# Patient Record
Sex: Female | Born: 1960 | Hispanic: Yes | Marital: Married | Smoking: Never smoker
Health system: Southern US, Community
[De-identification: ages and names within clinical notes are randomized; demographics above are authoritative.]

---

## 2007-12-08 ENCOUNTER — Ambulatory Visit: Payer: Self-pay

## 2012-10-09 ENCOUNTER — Emergency Department: Payer: Self-pay | Admitting: Internal Medicine

## 2013-06-17 ENCOUNTER — Ambulatory Visit: Payer: Self-pay | Admitting: Family Medicine

## 2013-08-11 HISTORY — PX: BREAST BIOPSY: SHX20

## 2013-08-30 ENCOUNTER — Ambulatory Visit: Payer: Self-pay | Admitting: Family Medicine

## 2013-09-05 ENCOUNTER — Ambulatory Visit: Payer: Self-pay | Admitting: Family Medicine

## 2013-09-07 LAB — PATHOLOGY REPORT

## 2017-07-24 ENCOUNTER — Other Ambulatory Visit: Payer: Self-pay

## 2017-07-24 ENCOUNTER — Encounter: Payer: Self-pay | Admitting: Emergency Medicine

## 2017-07-24 ENCOUNTER — Emergency Department
Admission: EM | Admit: 2017-07-24 | Discharge: 2017-07-24 | Disposition: A | Discharge status: Home / Self Care | Payer: BLUE CROSS/BLUE SHIELD | Attending: Emergency Medicine | Admitting: Emergency Medicine

## 2017-07-24 DIAGNOSIS — B029 Zoster without complications: Secondary | ICD-10-CM | POA: Insufficient documentation

## 2017-07-24 DIAGNOSIS — R21 Rash and other nonspecific skin eruption: Secondary | ICD-10-CM | POA: Diagnosis present

## 2017-07-24 LAB — COMPREHENSIVE METABOLIC PANEL
ALK PHOS: 88 U/L (ref 38–126)
ALT: 30 U/L (ref 14–54)
AST: 28 U/L (ref 15–41)
Albumin: 4.3 g/dL (ref 3.5–5.0)
Anion gap: 8 (ref 5–15)
BUN: 20 mg/dL (ref 6–20)
CALCIUM: 9.7 mg/dL (ref 8.9–10.3)
CO2: 26 mmol/L (ref 22–32)
CREATININE: 0.6 mg/dL (ref 0.44–1.00)
Chloride: 103 mmol/L (ref 101–111)
GFR calc Af Amer: >60 mL/min (ref >60)
GFR calc non Af Amer: >60 mL/min (ref >60)
GLUCOSE: 103 mg/dL — AB (ref 65–99)
Potassium: 4.1 mmol/L (ref 3.5–5.1)
Sodium: 137 mmol/L (ref 135–145)
Total Bilirubin: 0.6 mg/dL (ref 0.3–1.2)
Total Protein: 7.9 g/dL (ref 6.5–8.1)

## 2017-07-24 LAB — LIPASE, BLOOD: LIPASE: 21 U/L (ref 11–51)

## 2017-07-24 LAB — CBC
HCT: 41.5 % (ref 35.0–47.0)
Hemoglobin: 14.1 g/dL (ref 12.0–16.0)
MCH: 28.8 pg (ref 26.0–34.0)
MCHC: 34 g/dL (ref 32.0–36.0)
MCV: 84.7 fL (ref 80.0–100.0)
PLATELETS: 211 10*3/uL (ref 150–440)
RBC: 4.9 MIL/uL (ref 3.80–5.20)
RDW: 12.8 % (ref 11.5–14.5)
WBC: 9.8 10*3/uL (ref 3.6–11.0)

## 2017-07-24 MED ORDER — ONDANSETRON 4 MG PO TBDP: 4.0000 mg | ORAL_TABLET | Freq: Three times a day (TID) | ORAL | 0 refills | Status: DC | PRN

## 2017-07-24 MED ORDER — ONDANSETRON 4 MG PO TBDP
4.0000 mg | ORAL_TABLET | Freq: Once | ORAL | Status: AC
  Administered 2017-07-24: 4 mg via ORAL
  Filled 2017-07-24: qty 1

## 2017-07-24 MED ORDER — OXYCODONE-ACETAMINOPHEN 5-325 MG PO TABS: 1.0000 | ORAL_TABLET | Freq: Four times a day (QID) | ORAL | 0 refills | Status: AC | PRN

## 2017-07-24 MED ORDER — OXYCODONE-ACETAMINOPHEN 5-325 MG PO TABS
1.0000 | ORAL_TABLET | Freq: Once | ORAL | Status: AC
  Administered 2017-07-24: 1 via ORAL
  Filled 2017-07-24: qty 1

## 2017-07-24 NOTE — ED Notes (Signed)
Daphine DeutscherMartin #[id number]

## 2017-07-24 NOTE — ED Provider Notes (Signed)
[facility] Emergency Department Provider Note  Time seen: 7:32 PM  I have reviewed the triage vital signs and the nursing notes. Spanish interpreter used for this evaluation  HISTORY  Chief Complaint Abdominal Pain and Back Pain    HPI Kelly Summers is a 56 y.o. female with no past medical history presents to the emergency department for rash and pain.  According to the patient for the past 7 or 8 days she has been experiencing pain in the right lower back wrapping around to the right lower abdomen.  Was seen by her doctor for the same and noted to have a rash in this area was started on valacyclovir and prednisone approximately 5 days ago.  Patient states she continues to have pain in this area and since starting the medication she has noticed a bitter/sour taste in her mouth with occasional reflux.  Patient was concerned that something else could be ongoing since the pain is not resolved so she came to the emergency department for evaluation.  Patient denies any fever.  Denies diarrhea.  Denies vomiting.  History reviewed. No pertinent past medical history.  There are no active problems to display for this patient.   Past Surgical History:  Procedure Laterality Date  . CESAREAN SECTION      Prior to Admission medications   Not on File    No Known Allergies  History reviewed. No pertinent family history.  Social History Social History   Tobacco Use  . Smoking status: Never Smoker  . Smokeless tobacco: Never Used  Substance Use Topics  . Alcohol use: No    Frequency: Never  . Drug use: No    Review of Systems Constitutional: Negative for fever. Cardiovascular: Negative for chest pain. Respiratory: Negative for shortness of breath. Gastrointestinal: Negative for abdominal pain, vomiting and diarrhea.  Does state occasional reflux. Genitourinary: Negative for dysuria. Musculoskeletal: Right lower back pain Skin: Rash from right  lower back around right lower abdomen. All other ROS negative  ____________________________________________   PHYSICAL EXAM:  VITAL SIGNS: ED Triage Vitals  Enc Vitals Group     BP 07/24/17 1738 (!) 161/75     Pulse Rate 07/24/17 1738 83     Resp 07/24/17 1738 18     Temp 07/24/17 1738 98.4 F (36.9 C)     Temp Source 07/24/17 1738 Oral     SpO2 07/24/17 1738 96 %     Weight 07/24/17 1737 157 lb (71.2 kg)     Height 07/24/17 1737 5\' 3"  (1.6 m)     Head Circumference --      Peak Flow --      Pain Score 07/24/17 1742 7     Pain Loc --      Pain Edu? --      Excl. in GC? --     Constitutional: Alert. Well appearing and in no distress. Eyes: Normal exam ENT   Head: Normocephalic and atraumatic.   Mouth/Throat: Mucous membranes are moist. Cardiovascular: Normal rate, regular rhythm. No murmur Respiratory: Normal respiratory effort without tachypnea nor retractions. Breath sounds are clear  Gastrointestinal: Soft, slight tenderness over very low right lower quadrant which correlates with the area of rash.  No mid or upper abdominal tenderness.  No rebound or guarding.  No distention. Musculoskeletal: Patient has tenderness around her right lower back as well correlates with area of rash. Neurologic:  Normal speech and language. No gross focal neurologic deficits Skin:  Skin is warm,  patient has a rash that appears to be vesicular in nature starting in the right lower back wrapping around to the right lower abdomen.  Does not cross midline.  Overall consistent with shingles.  Patient's abdominal and back tenderness correlates to the area of rash only. Psychiatric: Mood and affect are normal.   ____________________________________________   INITIAL IMPRESSION / ASSESSMENT AND PLAN / ED COURSE  Pertinent labs & imaging results that were available during my care of the patient were reviewed by me and considered in my medical decision making (see chart for  details).  Patient presents to the emergency department for pain and rash for the past 7 or 8 days.  Patient is currently on valacyclovir and prednisone.  Differential would include viral rash, herpes zoster, intra-abdominal pathology, other rash.  Overall the patient appears very well patient's tenderness which she describes as a burning sensation is solely located along the area of rash from her right lower back to her right lower abdomen.  Patient's labs are reassuring.  Overall examination is consistent with herpes zoster infection.  We will add prednisone and Zofran to the patient's medication regimen she will follow-up with her primary care doctor.  ____________________________________________   FINAL CLINICAL IMPRESSION(S) / ED DIAGNOSES  Herpes zoster    Minna AntisPaduchowski, Rogue Pautler, MD 07/24/17 1940

## 2017-07-24 NOTE — ED Triage Notes (Signed)
Pt is non-english speaking here with daughter, c/o RLQ pain and right lower back and buttocks pain x 3 weeks.  Pt reports the pain is worse when sitting. Describes the pain as sharp.  Pt was seen at the walk-in clinic last week and was dx with Shingles and states the medication has not helped.  Pt on Valtrex and cyclobenzabrine. Denies any dysuria

## 2017-12-08 ENCOUNTER — Other Ambulatory Visit: Payer: Self-pay | Admitting: Family Medicine

## 2017-12-08 DIAGNOSIS — Z1231 Encounter for screening mammogram for malignant neoplasm of breast: Secondary | ICD-10-CM

## 2018-01-28 ENCOUNTER — Ambulatory Visit
Admission: RE | Admit: 2018-01-28 | Discharge: 2018-01-28 | Disposition: A | Discharge status: Home / Self Care | Payer: BLUE CROSS/BLUE SHIELD | Source: Ambulatory Visit | Attending: Family Medicine | Admitting: Family Medicine

## 2018-01-28 ENCOUNTER — Other Ambulatory Visit: Payer: Self-pay | Admitting: Family Medicine

## 2018-01-28 DIAGNOSIS — Z1231 Encounter for screening mammogram for malignant neoplasm of breast: Secondary | ICD-10-CM | POA: Insufficient documentation

## 2019-04-05 ENCOUNTER — Other Ambulatory Visit: Payer: Self-pay | Admitting: Family Medicine

## 2019-04-05 DIAGNOSIS — Z1231 Encounter for screening mammogram for malignant neoplasm of breast: Secondary | ICD-10-CM

## 2019-05-23 ENCOUNTER — Ambulatory Visit
Admission: RE | Admit: 2019-05-23 | Discharge: 2019-05-23 | Disposition: A | Discharge status: Home / Self Care | Payer: BC Managed Care – PPO | Source: Ambulatory Visit | Attending: Family Medicine | Admitting: Family Medicine

## 2019-05-23 DIAGNOSIS — Z1231 Encounter for screening mammogram for malignant neoplasm of breast: Secondary | ICD-10-CM | POA: Insufficient documentation

## 2020-01-22 IMAGING — MG DIGITAL SCREENING BILAT W/ TOMO W/ CAD
8 series · 8 of 24 positions shown · non-contrast
Comparison: Previous exam(s).

CLINICAL DATA: Screening.

EXAM:
DIGITAL SCREENING BILATERAL MAMMOGRAM WITH TOMO AND CAD

[R CC synth-2D]
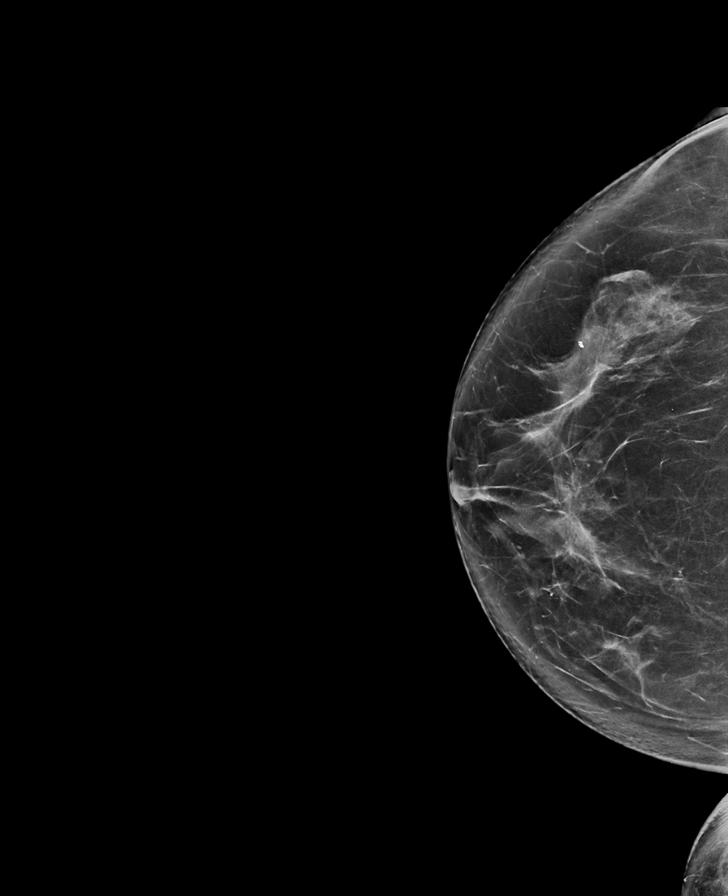

[L MLO synth-2D]
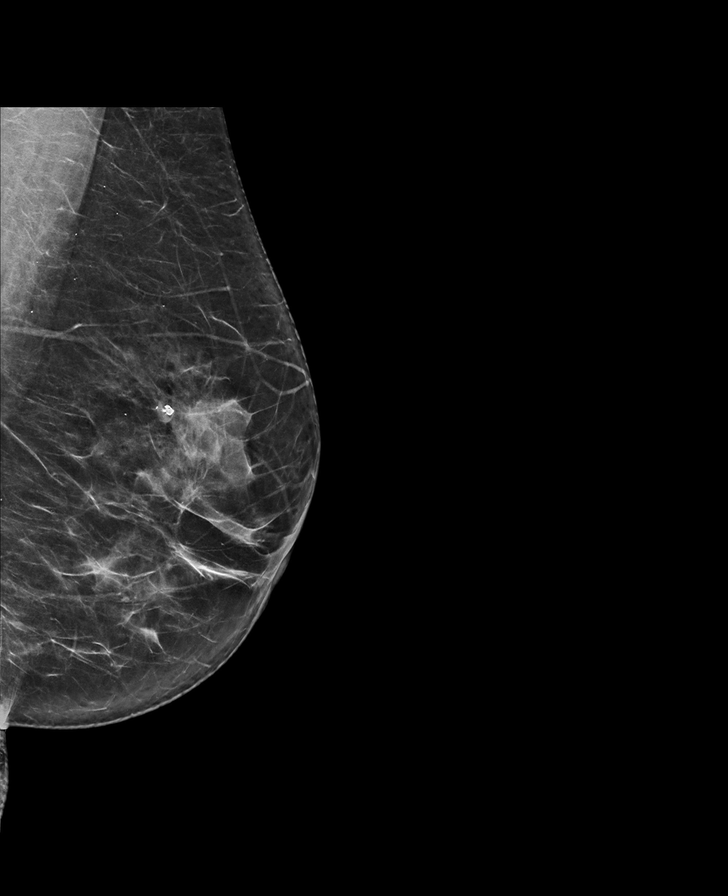

[R MLO synth-2D]
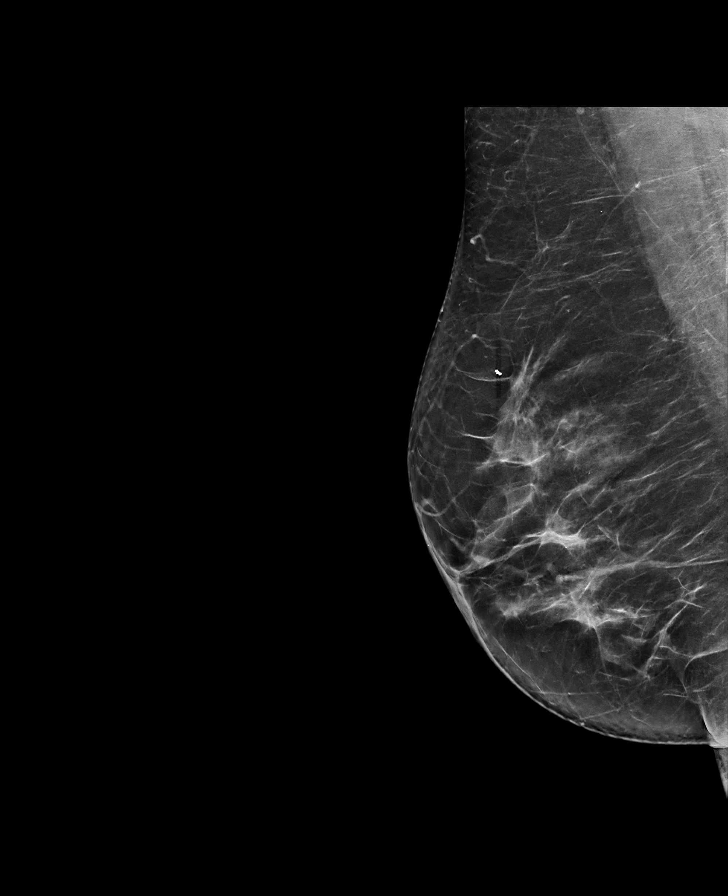

[L CC synth-2D]
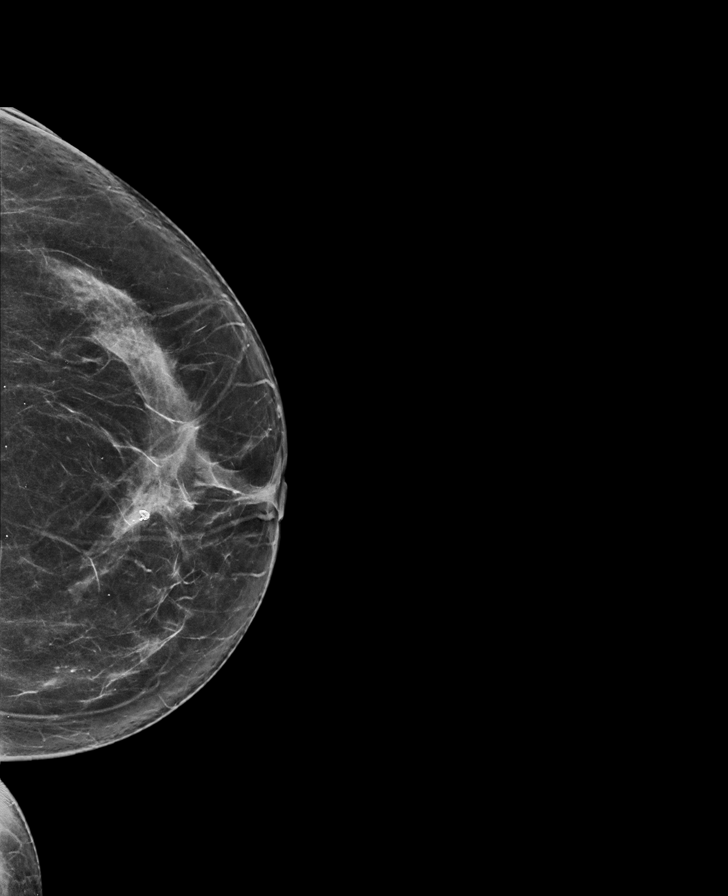

[L CC tomo · tomo slice 37/73.0]
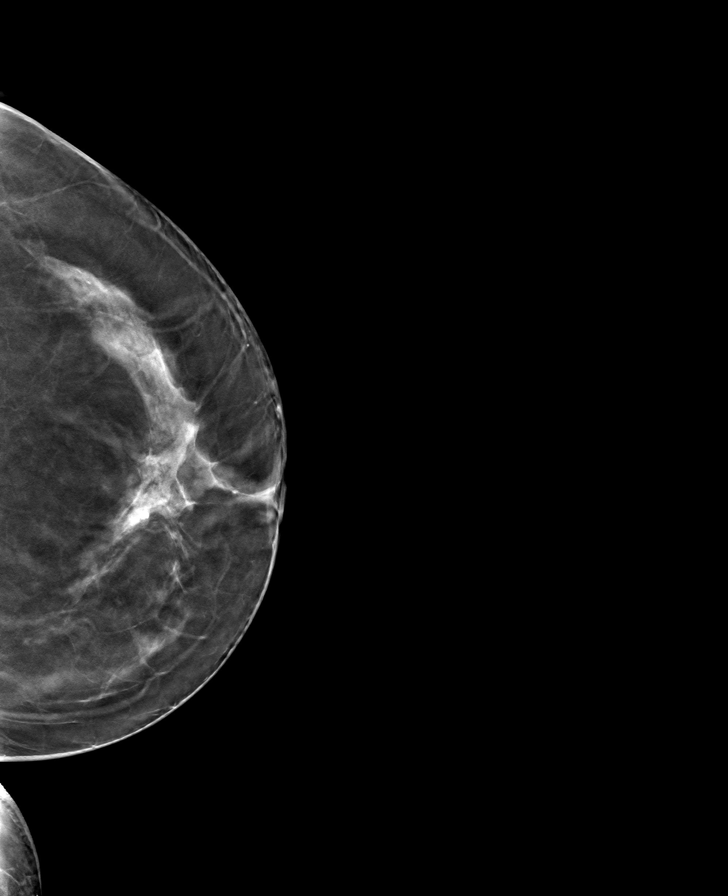

[R MLO tomo · tomo slice 40/79.0]
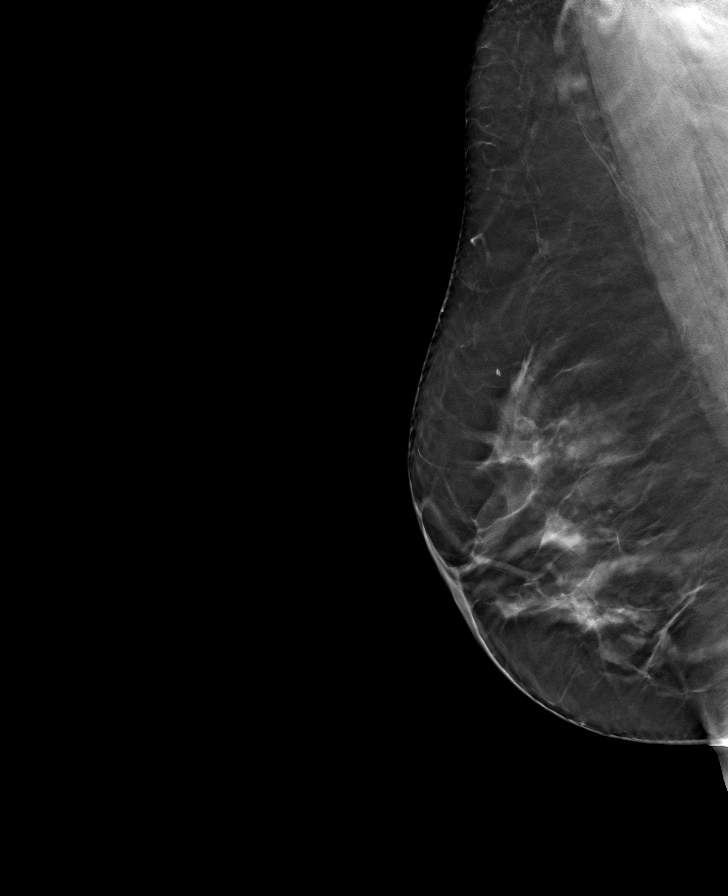

[L MLO tomo · tomo slice 39/77.0]
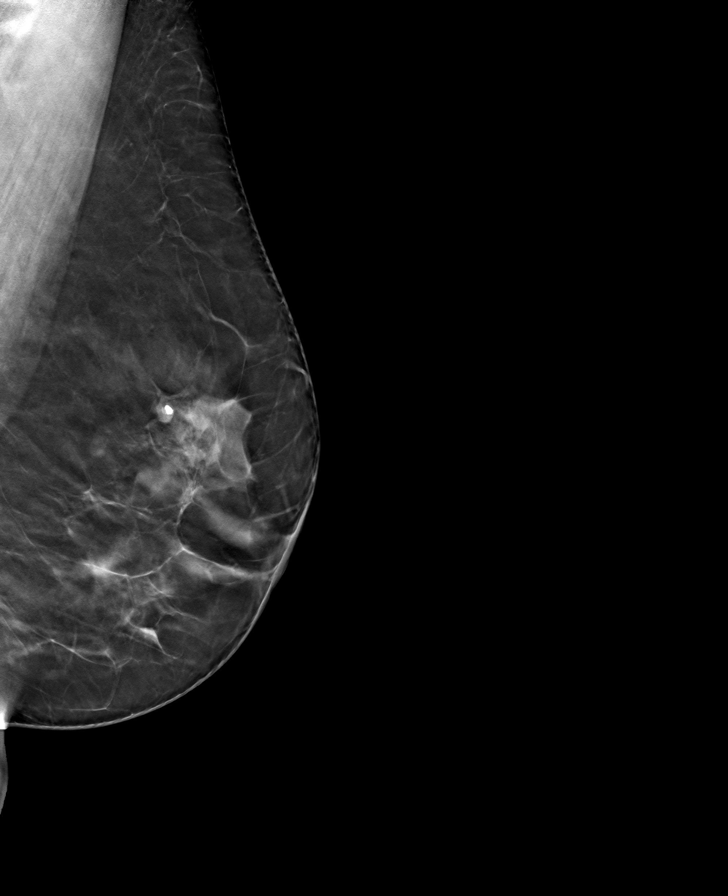

[R CC tomo · tomo slice 41/82.0]
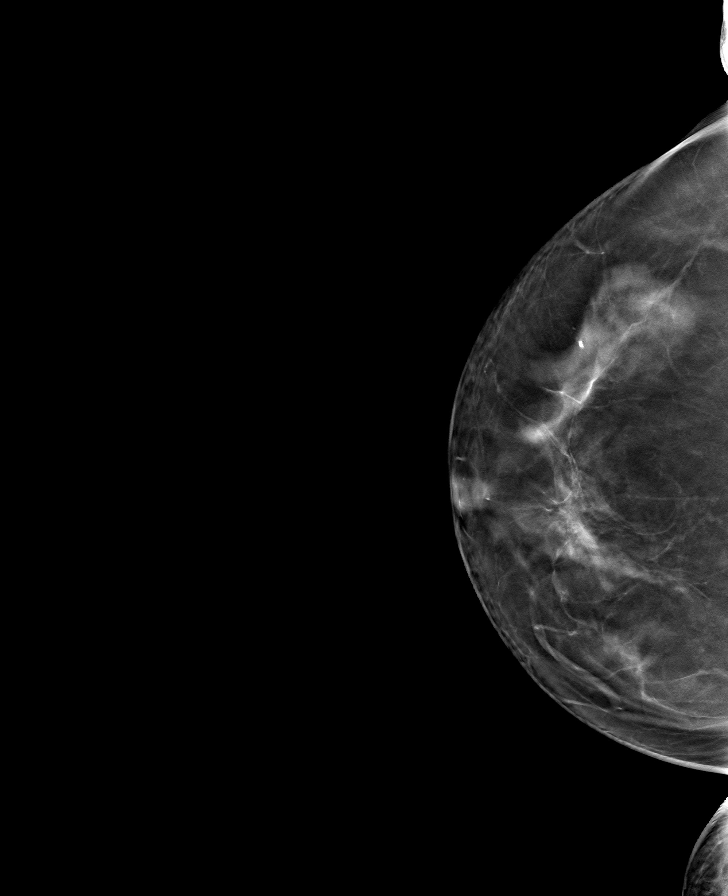

[8 of 24 positions shown; findings below may reference images not displayed]

ACR Breast Density Category c: The breast tissue is heterogeneously
dense, which may obscure small masses.
FINDINGS: There are no findings suspicious for malignancy. Images were
processed with CAD.
IMPRESSION: No mammographic evidence of malignancy. A result letter of this
screening mammogram will be mailed directly to the patient.

RECOMMENDATION:
Screening mammogram in one year. (Code:[id number]-U-LHB)

BI-RADS CATEGORY  1: Negative.

## 2020-07-13 ENCOUNTER — Other Ambulatory Visit: Payer: Self-pay | Admitting: Family Medicine

## 2020-12-11 ENCOUNTER — Other Ambulatory Visit: Payer: Self-pay | Admitting: Physician Assistant

## 2020-12-11 DIAGNOSIS — Z1231 Encounter for screening mammogram for malignant neoplasm of breast: Secondary | ICD-10-CM

## 2020-12-20 ENCOUNTER — Other Ambulatory Visit: Payer: Self-pay

## 2020-12-20 ENCOUNTER — Ambulatory Visit
Admission: RE | Admit: 2020-12-20 | Discharge: 2020-12-20 | Disposition: A | Discharge status: Home / Self Care | Payer: BC Managed Care – PPO | Source: Ambulatory Visit | Attending: Physician Assistant | Admitting: Physician Assistant

## 2020-12-20 DIAGNOSIS — Z1231 Encounter for screening mammogram for malignant neoplasm of breast: Secondary | ICD-10-CM | POA: Insufficient documentation

## 2022-07-01 ENCOUNTER — Emergency Department
Admission: EM | Admit: 2022-07-01 | Discharge: 2022-07-01 | Disposition: A | Discharge status: Home / Self Care | Payer: 59 | Attending: Emergency Medicine | Admitting: Emergency Medicine

## 2022-07-01 ENCOUNTER — Encounter: Payer: Self-pay | Admitting: Emergency Medicine

## 2022-07-01 ENCOUNTER — Emergency Department: Payer: 59

## 2022-07-01 DIAGNOSIS — R55 Syncope and collapse: Secondary | ICD-10-CM | POA: Diagnosis present

## 2022-07-01 DIAGNOSIS — K529 Noninfective gastroenteritis and colitis, unspecified: Secondary | ICD-10-CM | POA: Diagnosis not present

## 2022-07-01 LAB — CBC
HCT: 39.1 % (ref 36.0–46.0)
Hemoglobin: 13.2 g/dL (ref 12.0–15.0)
MCH: 28.9 pg (ref 26.0–34.0)
MCHC: 33.8 g/dL (ref 30.0–36.0)
MCV: 85.6 fL (ref 80.0–100.0)
Platelets: 148 10*3/uL — ABNORMAL LOW (ref 150–400)
RBC: 4.57 MIL/uL (ref 3.87–5.11)
RDW: 12.2 % (ref 11.5–15.5)
WBC: 12.5 10*3/uL — ABNORMAL HIGH (ref 4.0–10.5)
nRBC: 0 % (ref 0.0–0.2)

## 2022-07-01 LAB — HEPATIC FUNCTION PANEL
ALT: 40 U/L (ref 0–44)
AST: 90 U/L — ABNORMAL HIGH (ref 15–41)
Albumin: 3.7 g/dL (ref 3.5–5.0)
Alkaline Phosphatase: 68 U/L (ref 38–126)
Bilirubin, Direct: <0.1 mg/dL (ref 0.0–0.2)
Total Bilirubin: 0.7 mg/dL (ref 0.3–1.2)
Total Protein: 6.8 g/dL (ref 6.5–8.1)

## 2022-07-01 LAB — BASIC METABOLIC PANEL
Anion gap: 4 — ABNORMAL LOW (ref 5–15)
BUN: 21 mg/dL (ref 8–23)
CO2: 26 mmol/L (ref 22–32)
Calcium: 8.4 mg/dL — ABNORMAL LOW (ref 8.9–10.3)
Chloride: 108 mmol/L (ref 98–111)
Creatinine, Ser: 0.6 mg/dL (ref 0.44–1.00)
GFR, Estimated: >60 mL/min (ref >60)
Glucose, Bld: 112 mg/dL — ABNORMAL HIGH (ref 70–99)
Potassium: 3.5 mmol/L (ref 3.5–5.1)
Sodium: 138 mmol/L (ref 135–145)

## 2022-07-01 LAB — LIPASE, BLOOD: Lipase: 26 U/L (ref 11–51)

## 2022-07-01 LAB — TROPONIN I (HIGH SENSITIVITY)
Troponin I (High Sensitivity): 7 ng/L (ref <18)
Troponin I (High Sensitivity): 5 ng/L (ref <18)

## 2022-07-01 MED ORDER — ONDANSETRON 4 MG PO TBDP: 4.0000 mg | ORAL_TABLET | Freq: Three times a day (TID) | ORAL | 0 refills | Status: AC | PRN

## 2022-07-01 MED ORDER — KETOROLAC TROMETHAMINE 30 MG/ML IJ SOLN
15.0000 mg | Freq: Once | INTRAMUSCULAR | Status: AC
  Administered 2022-07-01: 15 mg via INTRAVENOUS
  Filled 2022-07-01: qty 1

## 2022-07-01 MED ORDER — SODIUM CHLORIDE 0.9 % IV BOLUS
1000.0000 mL | Freq: Once | INTRAVENOUS | Status: AC
  Administered 2022-07-01: 1000 mL via INTRAVENOUS

## 2022-07-01 MED ORDER — ONDANSETRON HCL 4 MG/2ML IJ SOLN
4.0000 mg | Freq: Once | INTRAMUSCULAR | Status: AC
  Administered 2022-07-01: 4 mg via INTRAVENOUS
  Filled 2022-07-01: qty 2

## 2022-07-01 NOTE — ED Provider Notes (Signed)
[facility] Provider Note    Event Date/Time   First MD Initiated Contact with Patient 07/01/22 0210     (approximate)  History   Chief Complaint: Near Syncope  HPI  Kelly Summers is a 61 y.o. female with no significant past medical history presents to the emergency department for a syncopal event.  According to the daughter the patient had been feeling well today until approximately 5 PM this evening when she developed nausea vomiting and diarrhea.  Patient states she felt like she needed to go the restroom she got up and had a syncopal event.  No history of syncope previously.  Believes she had her head is having some pain to the back of the head.  Patient denies any fever denies any abdominal pain no chest pain shortness of breath or cough.  Physical Exam   Triage Vital Signs: ED Triage Vitals  Enc Vitals Group     BP 07/01/22 0053 138/60     Pulse Rate 07/01/22 0053 72     Resp 07/01/22 0053 18     Temp 07/01/22 0053 98 F (36.7 C)     Temp Source 07/01/22 0053 Oral     SpO2 07/01/22 0050 100 %     Weight 07/01/22 0103 159 lb 6.3 oz (72.3 kg)     Height 07/01/22 0103 5\' 3"  (1.6 m)     Head Circumference --      Peak Flow --      Pain Score 07/01/22 0054 0     Pain Loc --      Pain Edu? --      Excl. in GC? --     Most recent vital signs: Vitals:   07/01/22 0050 07/01/22 0053  BP:  138/60  Pulse:  72  Resp:  18  Temp:  98 F (36.7 C)  SpO2: 100% 100%    General: Awake, no distress.  CV:  Good peripheral perfusion.  Regular rate and rhythm  Resp:  Normal effort.  Equal breath sounds bilaterally.  Abd:  No distention.  Soft, nontender.  No rebound or guarding.  9 abdomen   ED Results / Procedures / Treatments   EKG  EKG viewed and interpreted by myself shows a normal sinus rhythm at 70 bpm with a narrow QRS, normal axis, normal intervals, no concerning ST changes.  RADIOLOGY  I have reviewed and interpreted the CT  head images.  No intracranial abnormality seen on my evaluation. Radiology is read the CT scan as negative. X-rays negative.   MEDICATIONS ORDERED IN ED: Medications  sodium chloride 0.9 % bolus 1,000 mL (has no administration in time range)  sodium chloride 0.9 % bolus 1,000 mL (has no administration in time range)  ondansetron (ZOFRAN) injection 4 mg (has no administration in time range)  ketorolac (TORADOL) 30 MG/ML injection 15 mg (has no administration in time range)     IMPRESSION / MDM / ASSESSMENT AND PLAN / ED COURSE  I reviewed the triage vital signs and the nursing notes.  Patient's presentation is most consistent with acute presentation with potential threat to life or bodily function.  Patient presents the emergency department for a syncopal event as well as nausea vomiting and diarrhea.  Symptoms started around 5 PM today with nausea vomiting and diarrhea.  Tonight patient states he got up to use the bathroom and then had a syncopal event.  No history of syncope previously.  We will check labs including cardiac  enzymes we will also check abdominal labs, EKG.  Given the headache we obtain CT imaging of the head given the syncopal event chest x-ray is a precaution.  We will IV hydrate patient is complaining of muscular spasms/aches in her arms and legs.  We will dose of small dose of Toradol, Zofran while IV hydrating and awaiting for the results.  Patient agreeable to plan.  Differential would include infectious etiology such as pneumonia, COVID, UTI, dehydration/hypovolemia from the patient's nausea vomiting diarrhea, gastroenteritis, etc.  His labs have resulted showing slight LFT elevation, ultrasound has been performed and shows normal results.  Chemistry otherwise normal.  Lipase is normal, troponin negative x 2.  CBC shows very slight leukocytosis otherwise normal.  Patient has received 2 L of fluid states she is feeling much better.  We will discharge the patient home with  supportive care including Zofran fluids and rest.  FINAL CLINICAL IMPRESSION(S) / ED DIAGNOSES   Nausea vomiting diarrhea Syncopal event    Note:  This document was prepared using Dragon voice recognition software and may include unintentional dictation errors.   Minna Antis, MD 07/01/22 [phone]

## 2022-07-01 NOTE — ED Triage Notes (Signed)
Pt presents via POV with complaints of a syncopal episode tonight. Pt was vomiting tonight had got up from the restroom quickly causing her to feel dizzy and she ended up on the floor. Pt unsure if she hit her head, denies a headache or neck tenderness - no blood thinners. Endorses bilateral leg cramping since the episode. Denies CP or SOB.

## 2022-07-01 NOTE — ED Triage Notes (Signed)
EMS brings pt in from home for reports of syncopal episode; N/V, dizziness since 8pm; recent BP med changes

## 2022-07-21 ENCOUNTER — Other Ambulatory Visit: Payer: Self-pay | Admitting: Nurse Practitioner

## 2022-07-21 DIAGNOSIS — Z1231 Encounter for screening mammogram for malignant neoplasm of breast: Secondary | ICD-10-CM

## 2022-08-27 ENCOUNTER — Ambulatory Visit
Admission: RE | Admit: 2022-08-27 | Discharge: 2022-08-27 | Disposition: A | Discharge status: Home / Self Care | Payer: 59 | Source: Ambulatory Visit | Attending: Nurse Practitioner | Admitting: Nurse Practitioner

## 2022-08-27 DIAGNOSIS — Z1231 Encounter for screening mammogram for malignant neoplasm of breast: Secondary | ICD-10-CM

## 2023-01-19 ENCOUNTER — Telehealth: Payer: Self-pay

## 2023-01-19 NOTE — Telephone Encounter (Signed)
Patient and sister called to schedule colonoscopy. Patient gave me the okay for you to call her sister carmen back to schedule her colonoscopy for her. Patient is off work the week of July 4th and would like it during that week if possible.

## 2023-01-20 NOTE — Telephone Encounter (Signed)
Returned patients sister Carmen's call to schedule patients colonoscopy.  LVM for Porfirio Mylar to return my call.   Thanks, [place], [place]

## 2023-09-15 ENCOUNTER — Other Ambulatory Visit: Payer: Self-pay | Admitting: Family Medicine

## 2023-09-15 DIAGNOSIS — Z1231 Encounter for screening mammogram for malignant neoplasm of breast: Secondary | ICD-10-CM

## 2023-10-06 ENCOUNTER — Ambulatory Visit
Admission: RE | Admit: 2023-10-06 | Discharge: 2023-10-06 | Disposition: A | Discharge status: Home / Self Care | Payer: 59 | Source: Ambulatory Visit | Attending: Family Medicine | Admitting: Family Medicine

## 2023-10-06 DIAGNOSIS — Z1231 Encounter for screening mammogram for malignant neoplasm of breast: Secondary | ICD-10-CM | POA: Diagnosis present
